# Patient Record
Sex: Male | Born: 1995 | Race: Black or African American | Hispanic: No | Marital: Single | Smoking: Never smoker
Health system: Southern US, Community
[De-identification: ages and names within clinical notes are randomized; demographics above are authoritative.]

---
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
## 2016-07-20 ENCOUNTER — Ambulatory Visit (HOSPITAL_COMMUNITY)
Admission: EM | Admit: 2016-07-20 | Discharge: 2016-07-20 | Disposition: A | Discharge status: Home / Self Care | Payer: BLUE CROSS/BLUE SHIELD | Attending: Emergency Medicine | Admitting: Emergency Medicine

## 2016-07-20 ENCOUNTER — Encounter (HOSPITAL_COMMUNITY): Payer: Self-pay | Admitting: *Deleted

## 2016-07-20 DIAGNOSIS — B9789 Other viral agents as the cause of diseases classified elsewhere: Secondary | ICD-10-CM

## 2016-07-20 DIAGNOSIS — J069 Acute upper respiratory infection, unspecified: Secondary | ICD-10-CM

## 2016-07-20 MED ORDER — ACETAMINOPHEN 325 MG PO TABS
ORAL_TABLET | ORAL | Status: AC
  Filled 2016-07-20: qty 2

## 2016-07-20 MED ORDER — ACETAMINOPHEN 325 MG PO TABS
650.0000 mg | ORAL_TABLET | Freq: Once | ORAL | Status: AC
Start: 2016-07-20 — End: 2016-07-20
  Administered 2016-07-20: 650 mg via ORAL

## 2016-07-20 NOTE — ED Triage Notes (Signed)
C/O fever up to 102 with nasal congestion since yesterday.  Denies cough, sore throat, or body aches.  C/O HA.  Has taken Dayquil Cold & Flu.

## 2016-07-20 NOTE — ED Provider Notes (Signed)
CSN: [id number]     Arrival date & time 07/20/16  1850 History   None    Chief Complaint  Patient presents with  . Nasal Congestion  . Fever   (Consider location/radiation/quality/duration/timing/severity/associated sxs/prior Treatment) 21 year old male presents with 24 hour history of fever, cough, congestion, and body aches. No nausea, vomiting, or diarrhea, no sinus pain or pressure. Appetite has been reduced, he has been drinking fluids.   The history is provided by the patient.  Fever  Associated symptoms: chills, congestion, cough and rhinorrhea   Associated symptoms: no diarrhea, no ear pain, no myalgias, no nausea, no sore throat and no vomiting     History reviewed. No pertinent past medical history. History reviewed. No pertinent surgical history. No family history on file. Social History  Substance Use Topics  . Smoking status: Never Smoker  . Smokeless tobacco: Not on file  . Alcohol use No    Review of Systems  Constitutional: Positive for appetite change, chills and fever. Negative for fatigue.  HENT: Positive for congestion and rhinorrhea. Negative for ear discharge, ear pain, sinus pain, sinus pressure and sore throat.   Eyes: Negative.   Respiratory: Positive for cough. Negative for choking, shortness of breath and wheezing.   Cardiovascular: Negative.   Gastrointestinal: Negative for abdominal pain, diarrhea, nausea and vomiting.  Musculoskeletal: Negative.  Negative for arthralgias, back pain, myalgias, neck pain and neck stiffness.  Skin: Negative.   Neurological: Negative for dizziness, syncope, weakness and light-headedness.  All other systems reviewed and are negative.   Allergies  Patient has no known allergies.  Home Medications   Prior to Admission medications   Not on File   Meds Ordered and Administered this Visit   Medications  acetaminophen (TYLENOL) tablet 650 mg (650 mg Oral Given 07/20/16 1922)    BP 116/72   Pulse 84   Temp  102.7 F (39.3 C) (Oral)   Resp 16   SpO2 98%  No data found.   Physical Exam  Constitutional: He is oriented to person, place, and time. He appears well-developed and well-nourished. He appears ill. No distress.  HENT:  Right Ear: Tympanic membrane and external ear normal.  Left Ear: Tympanic membrane and external ear normal.  Nose: Nose normal. Right sinus exhibits no maxillary sinus tenderness and no frontal sinus tenderness. Left sinus exhibits no maxillary sinus tenderness and no frontal sinus tenderness.  Mouth/Throat: Uvula is midline and oropharynx is clear and moist. No oropharyngeal exudate.  Eyes: Pupils are equal, round, and reactive to light.  Neck: Normal range of motion. Neck supple. No JVD present.  Cardiovascular: Normal rate and regular rhythm.   Pulmonary/Chest: Effort normal and breath sounds normal. No respiratory distress. He has no wheezes.  Abdominal: Soft. Bowel sounds are normal.  Lymphadenopathy:    He has no cervical adenopathy.  Neurological: He is alert and oriented to person, place, and time.  Skin: Skin is warm and dry. Capillary refill takes less than 2 seconds. No rash noted. He is not diaphoretic. No erythema.  Psychiatric: He has a normal mood and affect.  Nursing note and vitals reviewed.   Urgent Care Course   Clinical Course     Procedures (including critical care time)  Labs Review Labs Reviewed - No data to display  Imaging Review No results found.   Visual Acuity Review  Right Eye Distance:   Left Eye Distance:   Bilateral Distance:    Right Eye Near:   Left Eye  Near:    Bilateral Near:         MDM   1. Viral upper respiratory tract infection   You most likely have a viral URI, I advise rest, plenty of fluids and management of symptoms with over the counter medicines. For symptoms you may take Tylenol as needed every 4-6 hours for body aches or fever, not to exceed 4,000 mg a day, Take mucinex or mucinex DM ever 12  hours with a full glass of water, you may use an inhaled steroid such as Flonase, 2 sprays each nostril once a day for congestion, or an antihistamine such as Claritin or Zyrtec once a day. Should your symptoms worsen or fail to resolve, follow up with your primary care provider or return to clinic.       Dorena Bodo, NP 07/20/16 2047

## 2016-07-20 NOTE — Discharge Instructions (Signed)
You most likely have a viral URI, I advise rest, plenty of fluids and management of symptoms with over the counter medicines. For symptoms you may take Tylenol as needed every 4-6 hours for body aches or fever, not to exceed 4,000 mg a day, Take mucinex or mucinex DM ever 12 hours with a full glass of water, you may use an inhaled steroid such as Flonase, 2 sprays each nostril once a day for congestion, or an antihistamine such as Claritin or Zyrtec once a day. Should your symptoms worsen or fail to resolve, follow up with your primary care provider or return to clinic.  °

## 2016-10-15 ENCOUNTER — Emergency Department (HOSPITAL_COMMUNITY): Payer: BLUE CROSS/BLUE SHIELD

## 2016-10-15 ENCOUNTER — Emergency Department (HOSPITAL_COMMUNITY)
Admission: EM | Admit: 2016-10-15 | Discharge: 2016-10-16 | Disposition: A | Discharge status: Home / Self Care | Payer: BLUE CROSS/BLUE SHIELD | Attending: Emergency Medicine | Admitting: Emergency Medicine

## 2016-10-15 ENCOUNTER — Encounter (HOSPITAL_COMMUNITY): Payer: Self-pay

## 2016-10-15 DIAGNOSIS — Y999 Unspecified external cause status: Secondary | ICD-10-CM | POA: Insufficient documentation

## 2016-10-15 DIAGNOSIS — Y9367 Activity, basketball: Secondary | ICD-10-CM | POA: Diagnosis not present

## 2016-10-15 DIAGNOSIS — M25572 Pain in left ankle and joints of left foot: Secondary | ICD-10-CM

## 2016-10-15 DIAGNOSIS — X509XXA Other and unspecified overexertion or strenuous movements or postures, initial encounter: Secondary | ICD-10-CM | POA: Diagnosis not present

## 2016-10-15 DIAGNOSIS — Y929 Unspecified place or not applicable: Secondary | ICD-10-CM | POA: Diagnosis not present

## 2016-10-15 NOTE — ED Triage Notes (Signed)
Pt states that he was playing basket ball and rolled his left ankle, mild swelling noted, pt is ambulatory.

## 2016-10-16 NOTE — ED Provider Notes (Signed)
[facility]-EMERGENCY DEPT Provider Note   CSN: [id number] Arrival date & time: 10/15/16  2209     History   Chief Complaint Chief Complaint  Patient presents with  . Ankle Pain    HPI Darrell Bullock is a 21 y.o. male who presents today with chief complaint acute onset sharp left ankle pain which began earlier this evening when he was playing basketball and "rolled his ankle in". Denies hitting head, fall, or LOC. States pain at rest is okay, but 6/10 when he rolls his ankle around, localized primarily to lateral malleolus. Also states some swelling. Denies weakness, numbness, tingling. He is able to bear weight on it. Denies HA, dizziness, CP/SOB, abd pain, n/v/d, neck or back pain.   The history is provided by the patient.    History reviewed. No pertinent past medical history.  There are no active problems to display for this patient.   History reviewed. No pertinent surgical history.     Home Medications    Prior to Admission medications   Not on File    Family History No family history on file.  Social History Social History  Substance Use Topics  . Smoking status: Never Smoker  . Smokeless tobacco: Never Used  . Alcohol use No     Allergies   Patient has no known allergies.   Review of Systems Review of Systems  Constitutional: Negative for chills and fever.  Respiratory: Negative for shortness of breath.   Cardiovascular: Negative for chest pain.  Gastrointestinal: Negative for abdominal pain, diarrhea, nausea and vomiting.  Musculoskeletal: Positive for arthralgias. Negative for back pain and neck pain.  Skin: Negative for wound.  Neurological: Negative for dizziness, syncope, weakness and headaches.     Physical Exam Updated Vital Signs BP 119/66   Pulse 61   Temp 98 F (36.7 C)   Resp 14   SpO2 98%   Physical Exam  Constitutional: He is oriented to person, place, and time. He appears well-developed and well-nourished. No distress.  HENT:    Head: Normocephalic and atraumatic.  No ttp or crepitus of skull.   Eyes: Conjunctivae are normal. Pupils are equal, round, and reactive to light. Right eye exhibits no discharge. Left eye exhibits no discharge. No scleral icterus.  Neck: Normal range of motion. Neck supple. No JVD present. No tracheal deviation present. No thyromegaly present.  No CSP ttp  Cardiovascular: Normal rate, regular rhythm, normal heart sounds and intact distal pulses.   2+ dp/pt pulses b/l. Good cap refill of great toes.   Pulmonary/Chest: Effort normal and breath sounds normal.  Abdominal: Soft. Bowel sounds are normal. He exhibits no distension. There is no tenderness.  Musculoskeletal: Normal range of motion. He exhibits edema and tenderness.  No midline spine tenderness. Full ROM of b/l ankles, with some pain with inversion and eversion of left ankle. Mild swelling of left lateral ankle noted with ttp posterior to the lateral malleolus extending to the anterior portion of the ankle. No medial malleolar ttp. No pain with palpation of the foot. No ligamentous laxity, no crepitus, no ecchymosis noted on exam. No erythema or warmth to joint. 5/5 strength of b/l ankles.   Neurological: He is alert and oriented to person, place, and time.  No facial droop, fluent speech, sensation globally intact and able to ambulate.   Skin: Skin is warm and dry. Capillary refill takes less than 2 seconds. He is not diaphoretic.  Psychiatric: He has a normal mood and affect. His behavior  is normal.     ED Treatments / Results  Labs (all labs ordered are listed, but only abnormal results are displayed) Labs Reviewed - No data to display  EKG  EKG Interpretation None       Radiology Dg Ankle Complete Left  Result Date: 10/15/2016 CLINICAL DATA:  Rolled left ankle, with left lateral ankle pain and swelling, while playing basketball. Initial encounter. EXAM: LEFT ANKLE COMPLETE - 3+ VIEW COMPARISON:  Left ankle radiographs  performed 10/12/2013 FINDINGS: There is no evidence of fracture or dislocation. The ankle mortise is intact; the interosseous space is within normal limits. No talar tilt or subluxation is seen. Minimal degenerative osseous fragments are seen about the medial malleolus. The joint spaces are preserved. Mild anterior soft tissue swelling is noted at the ankle. IMPRESSION: No evidence of fracture or dislocation. Electronically Signed   By: Roanna Raider M.D.   On: 10/15/2016 23:37    Procedures Procedures (including critical care time)  Medications Ordered in ED Medications - No data to display   Initial Impression / Assessment and Plan / ED Course  I have reviewed the triage vital signs and the nursing notes.  Pertinent labs & imaging results that were available during my care of the patient were reviewed by me and considered in my medical decision making (see chart for details).     20yom presents to ED with chief complaint left ankle pain after inverting foot while playing basketball earlier this evening. Pt afebrile, VSS, and able to bear weight on ankle but with pain. No deformity or ligamentous laxity noted on exam. Mild swelling noted to lateral aspect of ankle. Xray shows no acute fracture or dislocation. No suspicion of gout, septic joint, or osteomyelitis. Discussed symptomatic treatment including RICE, nsaids, heat, and gentle stretching. Pt given ASO brace in ED. Instructed to follow up with student health within 1 week if symptoms do not improve. Given strict ED return precautions. Pt verbalized understanding of and agreement with plan.    Final Clinical Impressions(s) / ED Diagnoses   Final diagnoses:  Acute left ankle pain    New Prescriptions There are no discharge medications for this patient.    Jeanie Sewer, PA-C 10/16/16 2040    Lavera Guise, MD 10/17/16 [phone]

## 2016-10-16 NOTE — Progress Notes (Signed)
Orthopedic Tech Progress Note Patient Details:  Darrell Bullock 25-Jul-1995 [id number]  Ortho Devices Type of Ortho Device: ASO, Crutches Ortho Device/Splint Location: lle Ortho Device/Splint Interventions: Ordered, Application   Trinna Post 10/16/2016, 1:21 AM

## 2016-10-16 NOTE — ED Notes (Signed)
Pt ambulatory from room to lobby using crutches; proper technique observed

## 2018-06-05 IMAGING — DX DG ANKLE COMPLETE 3+V*L*
3 series · 3 of 3 positions shown · non-contrast
Comparison: Left ankle radiographs performed 10/12/2013

CLINICAL DATA: Rolled left ankle, with left lateral ankle pain and
swelling, while playing basketball. Initial encounter.

EXAM:
LEFT ANKLE COMPLETE - 3+ VIEW

[ankle ap]
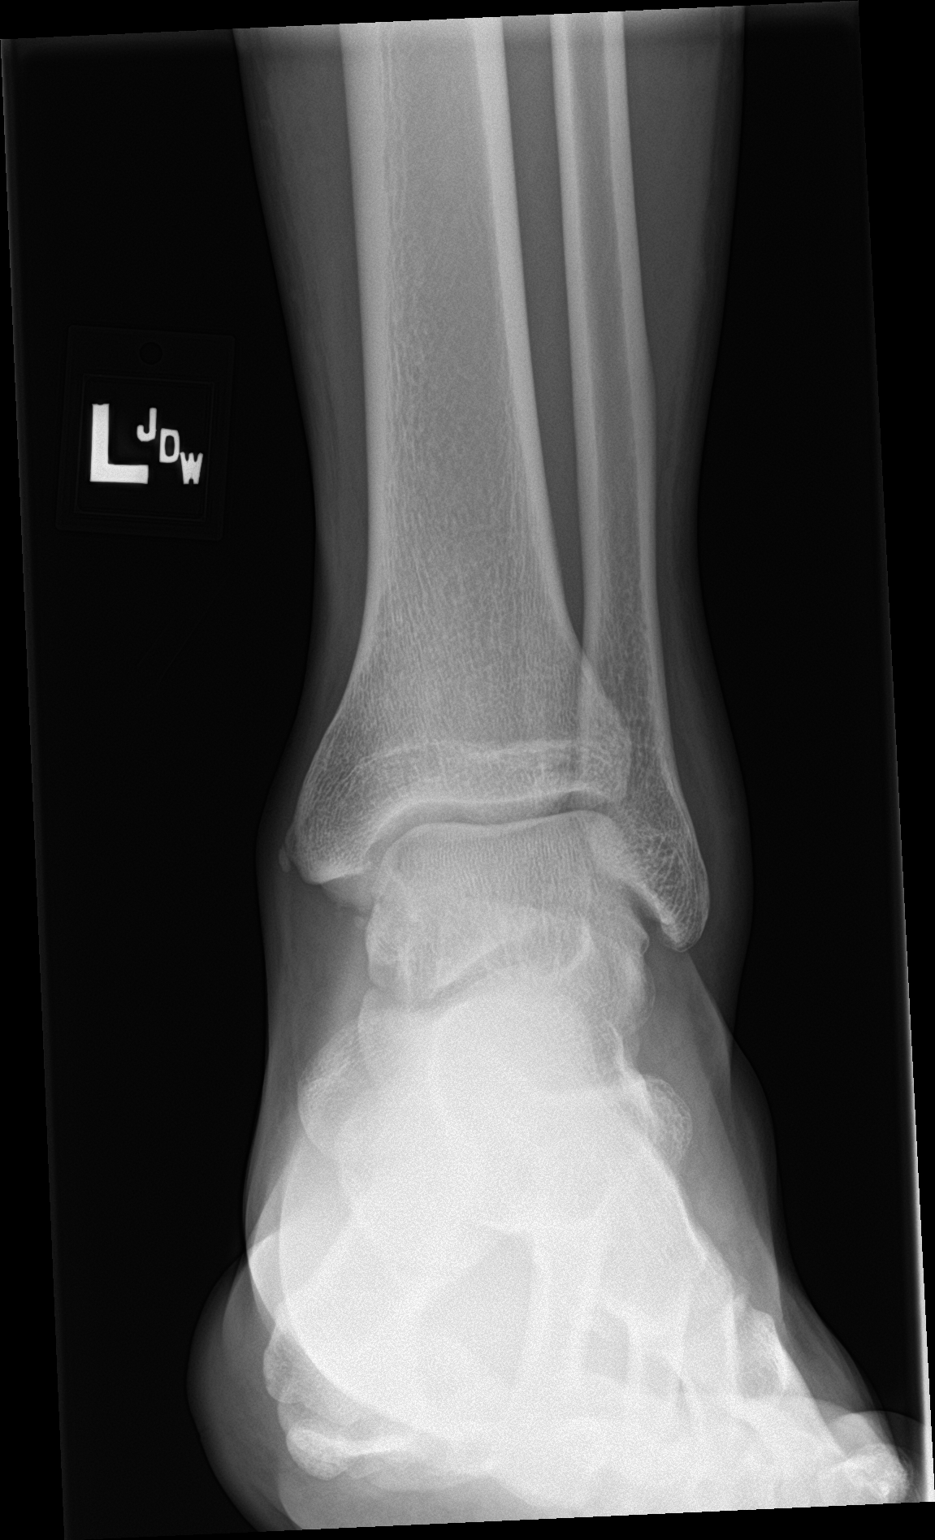

[ankle obl]
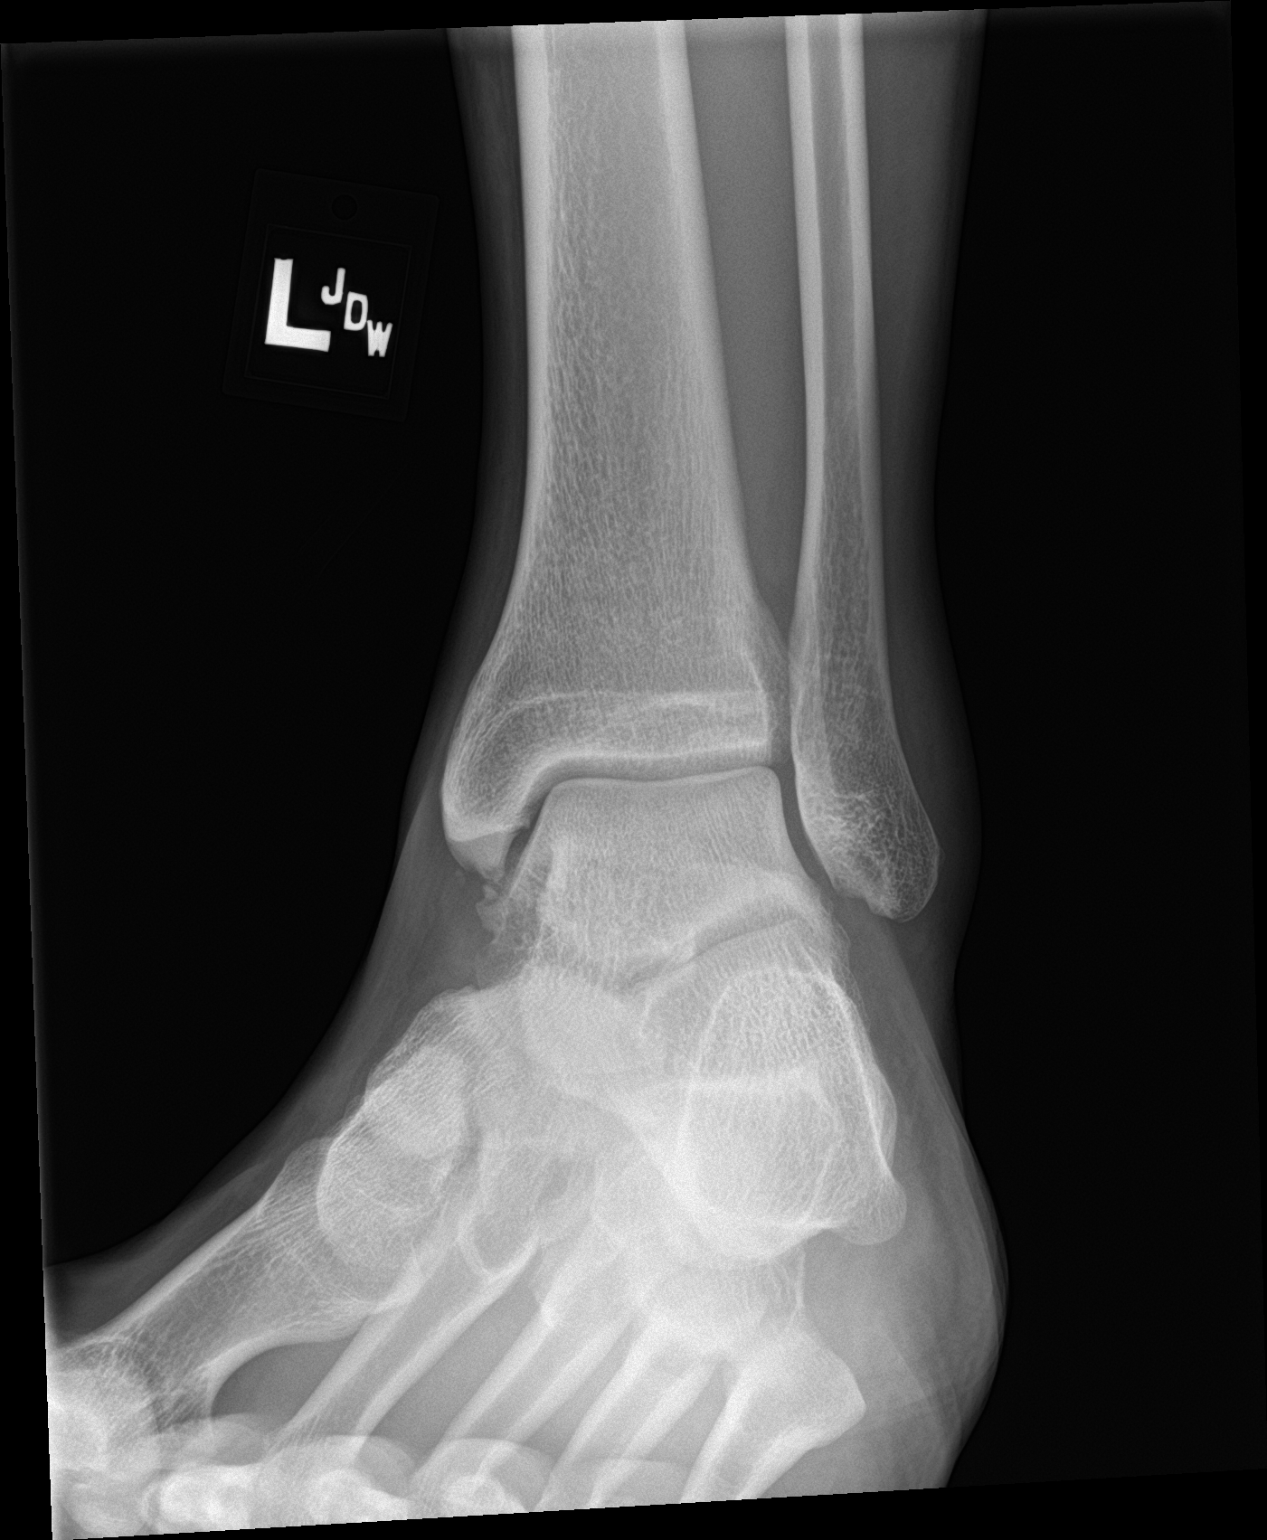

[ankle lat]
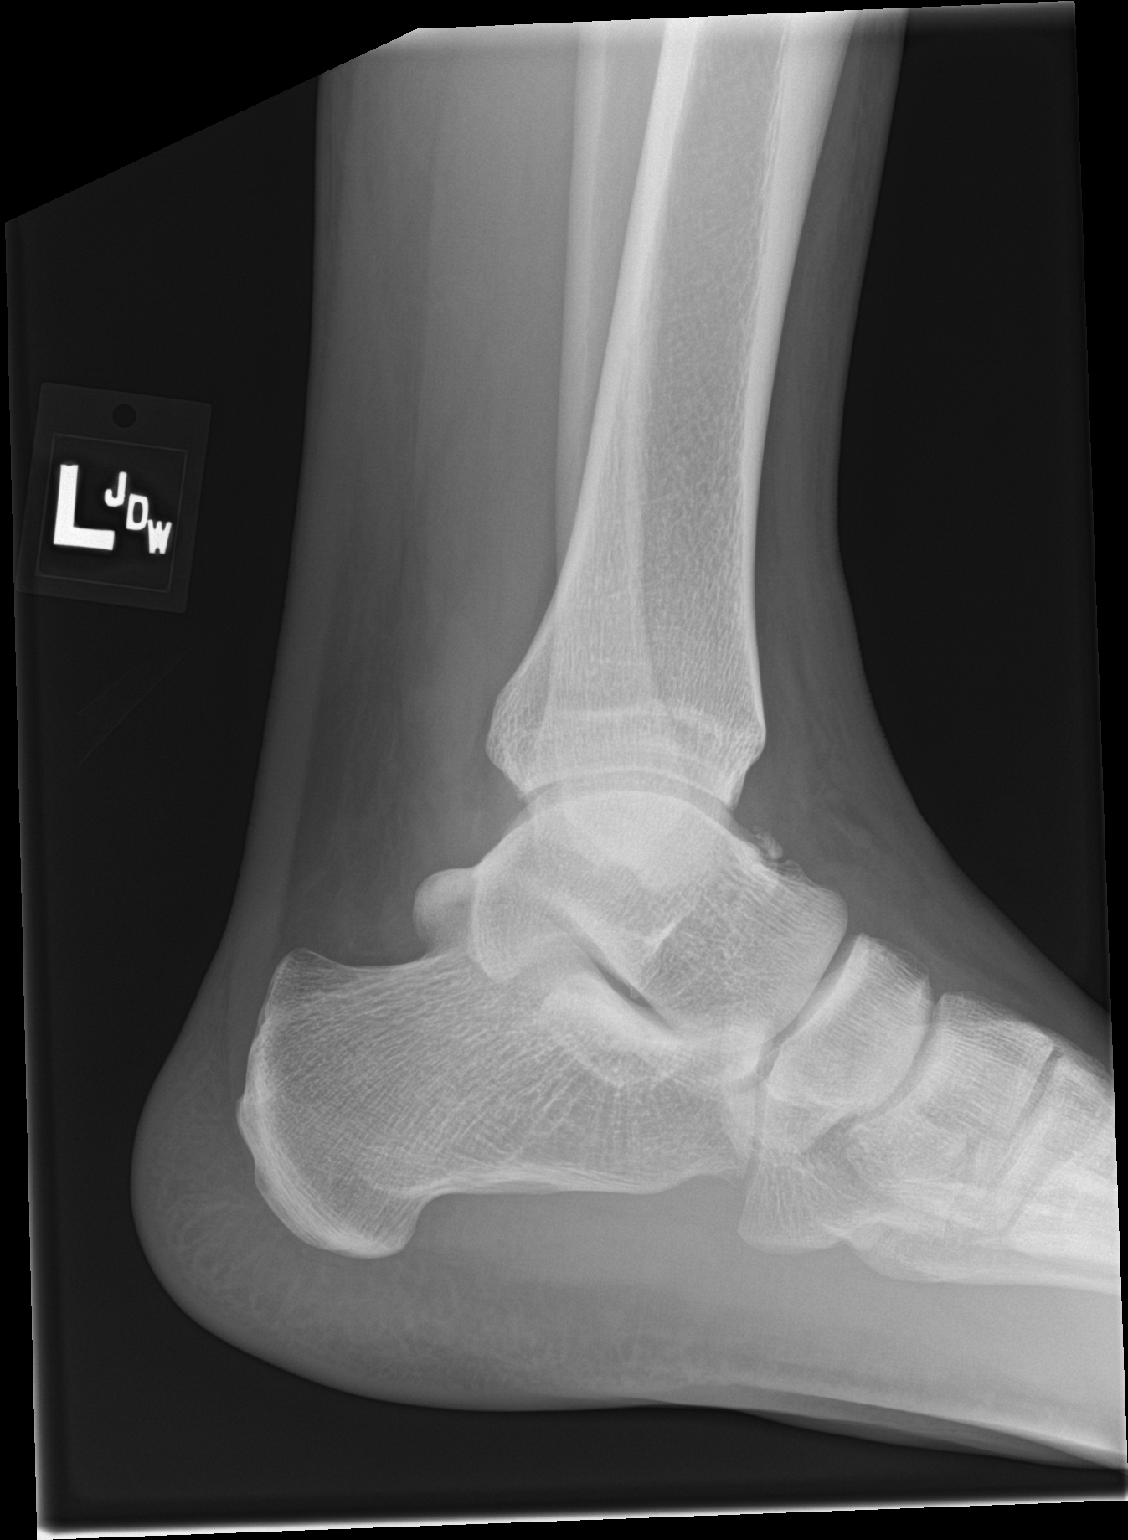

[3 of 3 positions shown; findings below may reference images not displayed]

FINDINGS: There is no evidence of fracture or dislocation. The ankle mortise
is intact; the interosseous space is within normal limits. No talar
tilt or subluxation is seen. Minimal degenerative osseous fragments
are seen about the medial malleolus.

The joint spaces are preserved. Mild anterior soft tissue swelling
is noted at the ankle.
IMPRESSION: No evidence of fracture or dislocation.

## 2018-12-31 ENCOUNTER — Emergency Department (HOSPITAL_COMMUNITY)
Admission: EM | Admit: 2018-12-31 | Discharge: 2018-12-31 | Disposition: A | Discharge status: Home / Self Care | Payer: BLUE CROSS/BLUE SHIELD | Attending: Emergency Medicine | Admitting: Emergency Medicine

## 2018-12-31 ENCOUNTER — Encounter (HOSPITAL_COMMUNITY): Payer: Self-pay | Admitting: Emergency Medicine

## 2018-12-31 ENCOUNTER — Other Ambulatory Visit: Payer: Self-pay

## 2018-12-31 DIAGNOSIS — L929 Granulomatous disorder of the skin and subcutaneous tissue, unspecified: Secondary | ICD-10-CM | POA: Insufficient documentation

## 2018-12-31 DIAGNOSIS — Y929 Unspecified place or not applicable: Secondary | ICD-10-CM | POA: Insufficient documentation

## 2018-12-31 DIAGNOSIS — Y9364 Activity, baseball: Secondary | ICD-10-CM | POA: Insufficient documentation

## 2018-12-31 DIAGNOSIS — Y999 Unspecified external cause status: Secondary | ICD-10-CM | POA: Insufficient documentation

## 2018-12-31 DIAGNOSIS — S60415A Abrasion of left ring finger, initial encounter: Secondary | ICD-10-CM | POA: Insufficient documentation

## 2018-12-31 DIAGNOSIS — W2131XA Struck by shoe cleats, initial encounter: Secondary | ICD-10-CM | POA: Insufficient documentation

## 2018-12-31 DIAGNOSIS — S61209A Unspecified open wound of unspecified finger without damage to nail, initial encounter: Secondary | ICD-10-CM

## 2018-12-31 MED ORDER — BACITRACIN ZINC 500 UNIT/GM EX OINT
TOPICAL_OINTMENT | Freq: Once | CUTANEOUS | Status: AC
  Administered 2018-12-31: 1 via TOPICAL
  Filled 2018-12-31: qty 0.9

## 2018-12-31 NOTE — ED Provider Notes (Signed)
[facility] EMERGENCY DEPARTMENT Provider Note   CSN: [id number] Arrival date & time: 12/31/18  2008     History   Chief Complaint Chief Complaint  Patient presents with  . Finger Injury    2 weeks ago    HPI Michel I Weygandt is a 23 y.o. male.     Patient presents with injury to his left distal ring finger.  Patient states that he injured it while playing baseball approximately 2 weeks ago.  Patient states that he cut it on a cleat of another player.  Patient has had some swelling and pain to the radial aspect of the left ring nailbed.  He had a more extensive abrasion extending more proximally which is healed.  He showed me a picture of what it looked like initially.  He was concerned that he was getting a more serious infection.  No difficulty moving the finger.  No purulent drainage from the area.  No bleeding.  Onset of symptoms acute.  Course is constant     History reviewed. No pertinent past medical history.  There are no active problems to display for this patient.   History reviewed. No pertinent surgical history.      Home Medications    Prior to Admission medications   Not on File    Family History No family history on file.  Social History Social History   Tobacco Use  . Smoking status: Never Smoker  . Smokeless tobacco: Never Used  Substance Use Topics  . Alcohol use: No  . Drug use: No     Allergies   Patient has no known allergies.   Review of Systems Review of Systems  Musculoskeletal: Positive for myalgias.  Skin: Positive for wound.     Physical Exam Updated Vital Signs BP 132/82 (BP Location: Right Arm)   Pulse (!) 57   Temp 98.2 F (36.8 C) (Oral)   Resp 18   SpO2 99%   Physical Exam Vitals signs and nursing note reviewed.  Constitutional:      Appearance: He is well-developed.  HENT:     Head: Normocephalic and atraumatic.  Eyes:     Conjunctiva/sclera: Conjunctivae normal.  Neck:   Musculoskeletal: Normal range of motion and neck supple.  Pulmonary:     Effort: No respiratory distress.  Musculoskeletal:     Comments: Patient with a very small granuloma extending from a minor wound at the radial aspect of the left ring fingernail.  No active drainage.  No signs of infection.  Patient has a healing abrasion more proximally on the radial aspect of the finger.  Full range of motion.  No signs of cellulitis or paronychia.  Skin:    General: Skin is warm and dry.  Neurological:     Mental Status: He is alert.      ED Treatments / Results  Labs (all labs ordered are listed, but only abnormal results are displayed) Labs Reviewed - No data to display  EKG    Radiology No results found.  Procedures Procedures (including critical care time)  Medications Ordered in ED Medications  bacitracin ointment (has no administration in time range)     Initial Impression / Assessment and Plan / ED Course  I have reviewed the triage vital signs and the nursing notes.  Pertinent labs & imaging results that were available during my care of the patient were reviewed by me and considered in my medical decision making (see chart for details).  Patient seen and examined.  Discussed continued monitoring with good wound care and antibiotic ointment versus anesthetizing wound and using silver nitrate to reduce the size of the small granuloma.  Patient states that he would like to continue to care for the wound.  He is reassured that there is not a severe infection.  We discussed signs and symptoms of worsening infection including worsening redness, pain, swelling, purulent drainage, decreased range of motion of the finger which should prompt immediate emergency department or PCP evaluation.  Patient verbalizes understanding agrees with plan.  Vital signs reviewed and are as follows: BP 132/82 (BP Location: Right Arm)   Pulse (!) 57   Temp 98.2 F (36.8 C) (Oral)   Resp 18    SpO2 99%    Final Clinical Impressions(s) / ED Diagnoses   Final diagnoses:  Finger wound, simple, open, initial encounter   Patient with minor finger wound complicated by small granuloma.  I suspect that this will recede as the wound heals.  Follow-up/return instructions as above.  ED Discharge Orders    None       Renne CriglerGeiple, Gregoire Bennis, Cordelia Poche-C 12/31/18 2140    Gerhard MunchLockwood, Robert, MD 01/01/19 [phone]

## 2018-12-31 NOTE — Discharge Instructions (Signed)
Please read and follow all provided instructions.  Your diagnoses today include:  1. Finger wound, simple, open, initial encounter     Tests performed today include:  Vital signs. See below for your results today.   Medications prescribed:   None  Take any prescribed medications only as directed.   Home care instructions:  Follow any educational materials contained in this packet. Keep affected area above the level of your heart when possible. Wash area gently twice a day with warm soapy water. Do not apply alcohol or hydrogen peroxide. Cover the area if it draining or weeping.   Use over-the-counter antibiotic ointment on the area daily and clean the area with mild soap and water twice a day.  Follow-up instructions: Return to the emergency department or see your doctor if you have persistent pain, worsening redness or swelling.  Return instructions:  Return to the Emergency Department if you have:  Fever  Worsening symptoms  Worsening pain  Worsening swelling  Redness of the skin that moves away from the affected area, especially if it streaks away from the affected area   Any other emergent concerns  Your vital signs today were: BP 132/82 (BP Location: Right Arm)    Pulse (!) 57    Temp 98.2 F (36.8 C) (Oral)    Resp 18    SpO2 99%  If your blood pressure (BP) was elevated above 135/85 this visit, please have this repeated by your doctor within one month. --------------

## 2018-12-31 NOTE — ED Triage Notes (Signed)
Patient reports left distal ring finger injury while playing baseball 2 weeks ago , presents with redness/mild swelling at affected finger.
